# Patient Record
Sex: Male | Born: 1975 | Hispanic: No | Marital: Married | Smoking: Never smoker
Health system: Southern US, Community
[De-identification: ages and names within clinical notes are randomized; demographics above are authoritative.]

## PROBLEM LIST (undated history)

## (undated) DIAGNOSIS — J321 Chronic frontal sinusitis: Secondary | ICD-10-CM

## (undated) DIAGNOSIS — M5090 Cervical disc disorder, unspecified, unspecified cervical region: Secondary | ICD-10-CM

## (undated) DIAGNOSIS — T7840XA Allergy, unspecified, initial encounter: Secondary | ICD-10-CM

## (undated) DIAGNOSIS — R519 Headache, unspecified: Secondary | ICD-10-CM

## (undated) HISTORY — PX: APPENDECTOMY: SHX54

## (undated) HISTORY — DX: Allergy, unspecified, initial encounter: T78.40XA

## (undated) HISTORY — PX: MOUTH SURGERY: SHX715

## (undated) HISTORY — DX: Headache, unspecified: R51.9

## (undated) HISTORY — DX: Cervical disc disorder, unspecified, unspecified cervical region: M50.90

## (undated) HISTORY — DX: Chronic frontal sinusitis: J32.1

---

## 2009-05-27 ENCOUNTER — Ambulatory Visit: Payer: Self-pay | Admitting: Internal Medicine

## 2010-11-03 ENCOUNTER — Emergency Department (HOSPITAL_COMMUNITY)
Admission: EM | Admit: 2010-11-03 | Discharge: 2010-11-03 | Disposition: A | Discharge status: Home / Self Care | Payer: No Typology Code available for payment source | Attending: Emergency Medicine | Admitting: Emergency Medicine

## 2010-11-03 ENCOUNTER — Emergency Department (HOSPITAL_COMMUNITY): Payer: No Typology Code available for payment source

## 2010-11-03 DIAGNOSIS — S41109A Unspecified open wound of unspecified upper arm, initial encounter: Secondary | ICD-10-CM | POA: Insufficient documentation

## 2010-11-03 DIAGNOSIS — S46909A Unspecified injury of unspecified muscle, fascia and tendon at shoulder and upper arm level, unspecified arm, initial encounter: Secondary | ICD-10-CM | POA: Insufficient documentation

## 2010-11-03 DIAGNOSIS — Y93H9 Activity, other involving exterior property and land maintenance, building and construction: Secondary | ICD-10-CM | POA: Insufficient documentation

## 2010-11-03 DIAGNOSIS — W268XXA Contact with other sharp object(s), not elsewhere classified, initial encounter: Secondary | ICD-10-CM | POA: Insufficient documentation

## 2010-11-03 DIAGNOSIS — Y92009 Unspecified place in unspecified non-institutional (private) residence as the place of occurrence of the external cause: Secondary | ICD-10-CM | POA: Insufficient documentation

## 2012-01-02 DIAGNOSIS — Z0000 Encounter for general adult medical examination without abnormal findings: Secondary | ICD-10-CM | POA: Insufficient documentation

## 2015-11-17 ENCOUNTER — Emergency Department (HOSPITAL_COMMUNITY): Payer: PRIVATE HEALTH INSURANCE

## 2015-11-17 ENCOUNTER — Emergency Department (HOSPITAL_COMMUNITY): Payer: PRIVATE HEALTH INSURANCE | Admitting: Anesthesiology

## 2015-11-17 ENCOUNTER — Observation Stay (HOSPITAL_COMMUNITY)
Admission: EM | Admit: 2015-11-17 | Discharge: 2015-11-18 | Disposition: A | Discharge status: Home / Self Care | Payer: PRIVATE HEALTH INSURANCE | Attending: Surgery | Admitting: Surgery

## 2015-11-17 ENCOUNTER — Encounter (HOSPITAL_COMMUNITY)
Admission: EM | Disposition: A | Discharge status: Home / Self Care | Payer: Self-pay | Source: Home / Self Care | Attending: Emergency Medicine

## 2015-11-17 ENCOUNTER — Encounter (HOSPITAL_COMMUNITY): Payer: Self-pay

## 2015-11-17 DIAGNOSIS — Z87891 Personal history of nicotine dependence: Secondary | ICD-10-CM | POA: Insufficient documentation

## 2015-11-17 DIAGNOSIS — K353 Acute appendicitis with localized peritonitis, without perforation or gangrene: Secondary | ICD-10-CM

## 2015-11-17 DIAGNOSIS — Z9049 Acquired absence of other specified parts of digestive tract: Secondary | ICD-10-CM

## 2015-11-17 DIAGNOSIS — K3580 Unspecified acute appendicitis: Secondary | ICD-10-CM | POA: Diagnosis present

## 2015-11-17 DIAGNOSIS — R1033 Periumbilical pain: Secondary | ICD-10-CM | POA: Diagnosis present

## 2015-11-17 HISTORY — PX: LAPAROSCOPIC APPENDECTOMY: SHX408

## 2015-11-17 LAB — CBC WITH DIFFERENTIAL/PLATELET
Basophils Absolute: 0 10*3/uL (ref 0.0–0.1)
Basophils Relative: 0 %
Eosinophils Absolute: 0.1 10*3/uL (ref 0.0–0.7)
Eosinophils Relative: 1 %
HCT: 44.8 % (ref 39.0–52.0)
Hemoglobin: 15.4 g/dL (ref 13.0–17.0)
Lymphocytes Relative: 17 %
Lymphs Abs: 1.5 10*3/uL (ref 0.7–4.0)
MCH: 29.6 pg (ref 26.0–34.0)
MCHC: 34.4 g/dL (ref 30.0–36.0)
MCV: 86.2 fL (ref 78.0–100.0)
Monocytes Absolute: 0.6 10*3/uL (ref 0.1–1.0)
Monocytes Relative: 7 %
Neutro Abs: 6.8 10*3/uL (ref 1.7–7.7)
Neutrophils Relative %: 75 %
Platelets: 199 10*3/uL (ref 150–400)
RBC: 5.2 MIL/uL (ref 4.22–5.81)
RDW: 13 % (ref 11.5–15.5)
WBC: 9 10*3/uL (ref 4.0–10.5)

## 2015-11-17 LAB — URINALYSIS, ROUTINE W REFLEX MICROSCOPIC
Bilirubin Urine: NEGATIVE
Glucose, UA: NEGATIVE mg/dL
Hgb urine dipstick: NEGATIVE
Ketones, ur: NEGATIVE mg/dL
Leukocytes, UA: NEGATIVE
Nitrite: NEGATIVE
Protein, ur: NEGATIVE mg/dL
Specific Gravity, Urine: >1.046 — ABNORMAL HIGH (ref 1.005–1.030)
pH: 5.5 (ref 5.0–8.0)

## 2015-11-17 LAB — COMPREHENSIVE METABOLIC PANEL
ALT: 21 U/L (ref 17–63)
AST: 22 U/L (ref 15–41)
Albumin: 4.3 g/dL (ref 3.5–5.0)
Alkaline Phosphatase: 51 U/L (ref 38–126)
Anion gap: 11 (ref 5–15)
BUN: 20 mg/dL (ref 6–20)
CO2: 22 mmol/L (ref 22–32)
Calcium: 9 mg/dL (ref 8.9–10.3)
Chloride: 105 mmol/L (ref 101–111)
Creatinine, Ser: 1.12 mg/dL (ref 0.61–1.24)
GFR calc Af Amer: >60 mL/min (ref >60)
GFR calc non Af Amer: >60 mL/min (ref >60)
Glucose, Bld: 85 mg/dL (ref 65–99)
Potassium: 3.7 mmol/L (ref 3.5–5.1)
Sodium: 138 mmol/L (ref 135–145)
Total Bilirubin: 0.8 mg/dL (ref 0.3–1.2)
Total Protein: 7.7 g/dL (ref 6.5–8.1)

## 2015-11-17 LAB — I-STAT CG4 LACTIC ACID, ED: Lactic Acid, Venous: 0.95 mmol/L (ref 0.5–2.0)

## 2015-11-17 LAB — LIPASE, BLOOD: Lipase: 29 U/L (ref 11–51)

## 2015-11-17 SURGERY — APPENDECTOMY, LAPAROSCOPIC
Anesthesia: General

## 2015-11-17 MED ORDER — MORPHINE SULFATE (PF) 4 MG/ML IV SOLN
4.0000 mg | Freq: Once | INTRAVENOUS | Status: AC
  Administered 2015-11-17: 4 mg via INTRAVENOUS
  Filled 2015-11-17: qty 1

## 2015-11-17 MED ORDER — METRONIDAZOLE IN NACL 5-0.79 MG/ML-% IV SOLN
500.0000 mg | INTRAVENOUS | Status: AC
  Administered 2015-11-17: 500 mg via INTRAVENOUS
  Filled 2015-11-17: qty 100

## 2015-11-17 MED ORDER — ACETAMINOPHEN 650 MG RE SUPP: 650.0000 mg | Freq: Four times a day (QID) | RECTAL | Status: DC | PRN

## 2015-11-17 MED ORDER — ONDANSETRON 4 MG PO TBDP: 4.0000 mg | ORAL_TABLET | Freq: Four times a day (QID) | ORAL | Status: DC | PRN

## 2015-11-17 MED ORDER — HYDROMORPHONE HCL 1 MG/ML IJ SOLN: 0.2500 mg | INTRAMUSCULAR | Status: DC | PRN

## 2015-11-17 MED ORDER — ONDANSETRON HCL 4 MG/2ML IJ SOLN: 4.0000 mg | Freq: Four times a day (QID) | INTRAMUSCULAR | Status: DC | PRN

## 2015-11-17 MED ORDER — GLYCOPYRROLATE 0.2 MG/ML IJ SOLN
INTRAMUSCULAR | Status: DC | PRN
  Administered 2015-11-17: 0.2 mg via INTRAVENOUS

## 2015-11-17 MED ORDER — LACTATED RINGERS IV SOLN
INTRAVENOUS | Status: DC | PRN
  Administered 2015-11-17 (×2): via INTRAVENOUS

## 2015-11-17 MED ORDER — IOPAMIDOL (ISOVUE-300) INJECTION 61%
100.0000 mL | Freq: Once | INTRAVENOUS | Status: AC | PRN
  Administered 2015-11-17: 100 mL via INTRAVENOUS

## 2015-11-17 MED ORDER — HEPARIN SODIUM (PORCINE) 5000 UNIT/ML IJ SOLN
5000.0000 [IU] | Freq: Three times a day (TID) | INTRAMUSCULAR | Status: DC
  Administered 2015-11-18: 5000 [IU] via SUBCUTANEOUS
  Filled 2015-11-17 (×4): qty 1

## 2015-11-17 MED ORDER — SODIUM CHLORIDE 0.9 % IV BOLUS (SEPSIS)
1000.0000 mL | Freq: Once | INTRAVENOUS | Status: AC
  Administered 2015-11-17: 1000 mL via INTRAVENOUS

## 2015-11-17 MED ORDER — HYDROCODONE-ACETAMINOPHEN 5-325 MG PO TABS
1.0000 | ORAL_TABLET | ORAL | Status: DC | PRN
  Administered 2015-11-18 (×2): 2 via ORAL
  Filled 2015-11-17 (×2): qty 2

## 2015-11-17 MED ORDER — DEXAMETHASONE SODIUM PHOSPHATE 10 MG/ML IJ SOLN
INTRAMUSCULAR | Status: DC | PRN
  Administered 2015-11-17: 10 mg via INTRAVENOUS

## 2015-11-17 MED ORDER — DEXTROSE 5 % IV SOLN: 2.0000 g | INTRAVENOUS | Status: DC

## 2015-11-17 MED ORDER — IOHEXOL 300 MG/ML  SOLN
50.0000 mL | Freq: Once | INTRAMUSCULAR | Status: AC | PRN
  Administered 2015-11-17: 50 mL via ORAL

## 2015-11-17 MED ORDER — SUGAMMADEX SODIUM 200 MG/2ML IV SOLN
INTRAVENOUS | Status: DC | PRN
  Administered 2015-11-17: 200 mg via INTRAVENOUS

## 2015-11-17 MED ORDER — MIDAZOLAM HCL 2 MG/2ML IJ SOLN
INTRAMUSCULAR | Status: AC
  Filled 2015-11-17: qty 2

## 2015-11-17 MED ORDER — MORPHINE SULFATE (PF) 2 MG/ML IV SOLN
1.0000 mg | INTRAVENOUS | Status: DC | PRN
  Administered 2015-11-17: 1 mg via INTRAVENOUS
  Filled 2015-11-17: qty 1

## 2015-11-17 MED ORDER — ONDANSETRON HCL 4 MG/2ML IJ SOLN
INTRAMUSCULAR | Status: AC
  Filled 2015-11-17: qty 2

## 2015-11-17 MED ORDER — LACTATED RINGERS IV SOLN: INTRAVENOUS | Status: DC

## 2015-11-17 MED ORDER — LACTATED RINGERS IV SOLN: INTRAVENOUS | Status: DC | PRN

## 2015-11-17 MED ORDER — ONDANSETRON HCL 4 MG/2ML IJ SOLN
INTRAMUSCULAR | Status: DC | PRN
  Administered 2015-11-17: 4 mg via INTRAVENOUS

## 2015-11-17 MED ORDER — SUCCINYLCHOLINE CHLORIDE 20 MG/ML IJ SOLN
INTRAMUSCULAR | Status: DC | PRN
  Administered 2015-11-17: 140 mg via INTRAVENOUS

## 2015-11-17 MED ORDER — ROCURONIUM BROMIDE 100 MG/10ML IV SOLN
INTRAVENOUS | Status: DC | PRN
  Administered 2015-11-17: 5 mg via INTRAVENOUS
  Administered 2015-11-17: 35 mg via INTRAVENOUS

## 2015-11-17 MED ORDER — ACETAMINOPHEN 325 MG PO TABS: 650.0000 mg | ORAL_TABLET | Freq: Four times a day (QID) | ORAL | Status: DC | PRN

## 2015-11-17 MED ORDER — LIDOCAINE HCL (CARDIAC) 20 MG/ML IV SOLN
INTRAVENOUS | Status: DC | PRN
  Administered 2015-11-17: 100 mg via INTRAVENOUS
  Administered 2015-11-17: 25 mg via INTRATRACHEAL

## 2015-11-17 MED ORDER — DEXTROSE 5 % IV SOLN
2.0000 g | INTRAVENOUS | Status: AC
  Administered 2015-11-17: 2 g via INTRAVENOUS
  Filled 2015-11-17: qty 2

## 2015-11-17 MED ORDER — HYDROMORPHONE HCL 1 MG/ML IJ SOLN
1.0000 mg | Freq: Once | INTRAMUSCULAR | Status: AC
  Administered 2015-11-17: 1 mg via INTRAVENOUS
  Filled 2015-11-17: qty 1

## 2015-11-17 MED ORDER — FENTANYL CITRATE (PF) 100 MCG/2ML IJ SOLN
INTRAMUSCULAR | Status: DC | PRN
  Administered 2015-11-17: 100 ug via INTRAVENOUS
  Administered 2015-11-17: 150 ug via INTRAVENOUS
  Administered 2015-11-17: 50 ug via INTRAVENOUS

## 2015-11-17 MED ORDER — BUPIVACAINE LIPOSOME 1.3 % IJ SUSP
20.0000 mL | Freq: Once | INTRAMUSCULAR | Status: AC
  Administered 2015-11-17: 20 mL
  Filled 2015-11-17: qty 20

## 2015-11-17 MED ORDER — ENOXAPARIN SODIUM 40 MG/0.4ML ~~LOC~~ SOLN
40.0000 mg | SUBCUTANEOUS | Status: DC
  Filled 2015-11-17: qty 0.4

## 2015-11-17 MED ORDER — SODIUM CHLORIDE 0.9 % IV SOLN
INTRAVENOUS | Status: DC
  Administered 2015-11-17: 17:00:00 via INTRAVENOUS

## 2015-11-17 MED ORDER — SUGAMMADEX SODIUM 200 MG/2ML IV SOLN
INTRAVENOUS | Status: AC
  Filled 2015-11-17: qty 2

## 2015-11-17 MED ORDER — KCL IN DEXTROSE-NACL 20-5-0.45 MEQ/L-%-% IV SOLN
INTRAVENOUS | Status: DC
  Administered 2015-11-17: 1000 mL via INTRAVENOUS
  Administered 2015-11-18: 06:00:00 via INTRAVENOUS
  Filled 2015-11-17 (×3): qty 1000

## 2015-11-17 MED ORDER — DIPHENHYDRAMINE HCL 25 MG PO CAPS: 25.0000 mg | ORAL_CAPSULE | Freq: Four times a day (QID) | ORAL | Status: DC | PRN

## 2015-11-17 MED ORDER — PROPOFOL 10 MG/ML IV BOLUS
INTRAVENOUS | Status: AC
  Filled 2015-11-17: qty 40

## 2015-11-17 MED ORDER — PROPOFOL 10 MG/ML IV BOLUS
INTRAVENOUS | Status: DC | PRN
  Administered 2015-11-17: 300 mg via INTRAVENOUS

## 2015-11-17 MED ORDER — PIPERACILLIN-TAZOBACTAM 3.375 G IVPB 30 MIN
3.3750 g | Freq: Three times a day (TID) | INTRAVENOUS | Status: AC
  Administered 2015-11-17: 3.375 g via INTRAVENOUS
  Filled 2015-11-17: qty 50

## 2015-11-17 MED ORDER — IOHEXOL 300 MG/ML  SOLN: 50.0000 mL | Freq: Once | INTRAMUSCULAR | Status: DC | PRN

## 2015-11-17 MED ORDER — ROCURONIUM BROMIDE 100 MG/10ML IV SOLN
INTRAVENOUS | Status: AC
  Filled 2015-11-17: qty 1

## 2015-11-17 MED ORDER — MORPHINE SULFATE (PF) 2 MG/ML IV SOLN: 2.0000 mg | INTRAVENOUS | Status: DC | PRN

## 2015-11-17 MED ORDER — FENTANYL CITRATE (PF) 250 MCG/5ML IJ SOLN
INTRAMUSCULAR | Status: AC
  Filled 2015-11-17: qty 5

## 2015-11-17 MED ORDER — NAPROXEN 500 MG PO TABS
500.0000 mg | ORAL_TABLET | Freq: Two times a day (BID) | ORAL | Status: DC | PRN
  Filled 2015-11-17: qty 1

## 2015-11-17 MED ORDER — DEXAMETHASONE SODIUM PHOSPHATE 10 MG/ML IJ SOLN
INTRAMUSCULAR | Status: AC
  Filled 2015-11-17: qty 1

## 2015-11-17 MED ORDER — OXYCODONE-ACETAMINOPHEN 5-325 MG PO TABS: 1.0000 | ORAL_TABLET | ORAL | Status: DC | PRN

## 2015-11-17 MED ORDER — DIPHENHYDRAMINE HCL 50 MG/ML IJ SOLN: 25.0000 mg | Freq: Four times a day (QID) | INTRAMUSCULAR | Status: DC | PRN

## 2015-11-17 MED ORDER — MIDAZOLAM HCL 5 MG/5ML IJ SOLN
INTRAMUSCULAR | Status: DC | PRN
  Administered 2015-11-17: 2 mg via INTRAVENOUS

## 2015-11-17 MED ORDER — LIDOCAINE HCL (CARDIAC) 20 MG/ML IV SOLN
INTRAVENOUS | Status: AC
  Filled 2015-11-17: qty 5

## 2015-11-17 MED ORDER — DIPHENHYDRAMINE HCL 50 MG/ML IJ SOLN
25.0000 mg | Freq: Once | INTRAMUSCULAR | Status: AC
Start: 2015-11-17 — End: 2015-11-17
  Administered 2015-11-17: 25 mg via INTRAVENOUS
  Filled 2015-11-17: qty 1

## 2015-11-17 SURGICAL SUPPLY — 35 items
APPLIER CLIP ROT 10 11.4 M/L (STAPLE) ×3
CABLE HIGH FREQUENCY MONO STRZ (ELECTRODE) ×3 IMPLANT
CLIP APPLIE ROT 10 11.4 M/L (STAPLE) ×1 IMPLANT
COVER SURGICAL LIGHT HANDLE (MISCELLANEOUS) ×3 IMPLANT
CUTTER FLEX LINEAR 45M (STAPLE) ×3 IMPLANT
DECANTER SPIKE VIAL GLASS SM (MISCELLANEOUS) ×3 IMPLANT
DRAPE LAPAROSCOPIC ABDOMINAL (DRAPES) ×3 IMPLANT
ELECT REM PT RETURN 9FT ADLT (ELECTROSURGICAL) ×3
ELECTRODE REM PT RTRN 9FT ADLT (ELECTROSURGICAL) ×1 IMPLANT
ENDOLOOP SUT PDS II  0 18 (SUTURE)
ENDOLOOP SUT PDS II 0 18 (SUTURE) IMPLANT
GLOVE BIOGEL M 8.0 STRL (GLOVE) ×3 IMPLANT
GOWN STRL REUS W/TWL XL LVL3 (GOWN DISPOSABLE) ×6 IMPLANT
KIT BASIN OR (CUSTOM PROCEDURE TRAY) ×3 IMPLANT
LIQUID BAND (GAUZE/BANDAGES/DRESSINGS) ×3 IMPLANT
NEEDLE INSUFFLATION 14GA 120MM (NEEDLE) ×3 IMPLANT
POUCH RETRIEVAL ECOSAC 10 (ENDOMECHANICALS) IMPLANT
POUCH RETRIEVAL ECOSAC 10MM (ENDOMECHANICALS)
POUCH SPECIMEN RETRIEVAL 10MM (ENDOMECHANICALS) IMPLANT
RELOAD 45 VASCULAR/THIN (ENDOMECHANICALS) IMPLANT
RELOAD STAPLE TA45 3.5 REG BLU (ENDOMECHANICALS) ×3 IMPLANT
SCISSORS LAP 5X45 EPIX DISP (ENDOMECHANICALS) IMPLANT
SCRUB PCMX 4 OZ (MISCELLANEOUS) ×3 IMPLANT
SET IRRIG TUBING LAPAROSCOPIC (IRRIGATION / IRRIGATOR) ×3 IMPLANT
SHEARS HARMONIC ACE PLUS 45CM (MISCELLANEOUS) ×3 IMPLANT
SLEEVE XCEL OPT CAN 5 100 (ENDOMECHANICALS) ×3 IMPLANT
STAPLER VISISTAT 35W (STAPLE) IMPLANT
SUT VIC AB 4-0 SH 18 (SUTURE) ×3 IMPLANT
TOWEL OR 17X26 10 PK STRL BLUE (TOWEL DISPOSABLE) ×3 IMPLANT
TRAY FOLEY W/METER SILVER 14FR (SET/KITS/TRAYS/PACK) ×3 IMPLANT
TRAY LAPAROSCOPIC (CUSTOM PROCEDURE TRAY) ×3 IMPLANT
TROCAR BLADELESS OPT 5 100 (ENDOMECHANICALS) ×3 IMPLANT
TROCAR XCEL 12X100 BLDLESS (ENDOMECHANICALS) ×3 IMPLANT
TROCAR XCEL BLUNT TIP 100MML (ENDOMECHANICALS) ×3 IMPLANT
TROCAR XCEL NON-BLD 11X100MML (ENDOMECHANICALS) IMPLANT

## 2015-11-17 NOTE — ED Notes (Addendum)
Pt with abdominal pain since last night.  ? Fever 102.  No n/v/d.  No change in urination.  RLQ pain. Worse with pressure.  Pt went to urgent care and told to come here.

## 2015-11-17 NOTE — Anesthesia Procedure Notes (Signed)
Procedure Name: Intubation Date/Time: 11/17/2015 5:39 PM Performed by: Illene SilverEVANS, Evah Rashid E Pre-anesthesia Checklist: Patient identified, Emergency Drugs available, Suction available and Patient being monitored Patient Re-evaluated:Patient Re-evaluated prior to inductionOxygen Delivery Method: Circle System Utilized Preoxygenation: Pre-oxygenation with 100% oxygen Intubation Type: IV induction Ventilation: Mask ventilation without difficulty Laryngoscope Size: Mac and 4 Grade View: Grade III Tube type: Oral Tube size: 8.0 mm Number of attempts: 1 Airway Equipment and Method: Stylet and Oral airway Placement Confirmation: ETT inserted through vocal cords under direct vision,  positive ETCO2 and breath sounds checked- equal and bilateral (slipped ETT under epiglottis with CO@ positive ) Secured at: 22 cm Tube secured with: Tape Dental Injury: Teeth and Oropharynx as per pre-operative assessment  Difficulty Due To: Difficult Airway- due to anterior larynx and Difficult Airway- due to large tongue

## 2015-11-17 NOTE — Anesthesia Preprocedure Evaluation (Signed)
Anesthesia Evaluation  Patient identified by MRN, date of birth, ID band Patient awake    Reviewed: Allergy & Precautions, H&P , NPO status , Patient's Chart, lab work & pertinent test results  Airway Mallampati: II  TM Distance: >3 FB Neck ROM: full    Dental no notable dental hx. (+) Teeth Intact, Dental Advisory Given   Pulmonary neg pulmonary ROS, former smoker,    Pulmonary exam normal breath sounds clear to auscultation       Cardiovascular Exercise Tolerance: Good negative cardio ROS Normal cardiovascular exam Rhythm:regular Rate:Normal     Neuro/Psych negative neurological ROS  negative psych ROS   GI/Hepatic negative GI ROS, Neg liver ROS,   Endo/Other  negative endocrine ROS  Renal/GU negative Renal ROS  negative genitourinary   Musculoskeletal   Abdominal   Peds  Hematology negative hematology ROS (+)   Anesthesia Other Findings   Reproductive/Obstetrics negative OB ROS                             Anesthesia Physical Anesthesia Plan  ASA: I and emergent  Anesthesia Plan: General   Post-op Pain Management:    Induction: Intravenous, Rapid sequence and Cricoid pressure planned  Airway Management Planned: Oral ETT  Additional Equipment:   Intra-op Plan:   Post-operative Plan: Extubation in OR  Informed Consent: I have reviewed the patients History and Physical, chart, labs and discussed the procedure including the risks, benefits and alternatives for the proposed anesthesia with the patient or authorized representative who has indicated his/her understanding and acceptance.   Dental Advisory Given  Plan Discussed with: CRNA and Surgeon  Anesthesia Plan Comments:         Anesthesia Quick Evaluation

## 2015-11-17 NOTE — Op Note (Signed)
Surgeon: Wenda LowMatt Skilar Marcou, MD, FACS  Asst:  Madelin RearJosh Rickey, MD  Anes:  general  Procedure: Laparoscopic appendectomy  Diagnosis: Acute appendicitis  Complications: None noted  EBL:   minimal cc  Drains: none  Description of Procedure:  [facility]  After anesthesia was administered and the patient was prepped a timeout was performed.  Access to the abdomen was achieved with Hasson technique above the umbilicus (prior umbilical hernia repair).  A 5 mm was placed in the lower midline and in the LLQ.  The appendix was plump and with slight exudate.  The base was easily visualized and isolated and divided with the blue load 4.5 cm Ethicon stapler.  The mesentery of the appendix was transected with the harmonic scalpel.  Hemostasis was achieved.  The appendix was removed in a bag.  The Hasson port was closed with 0 vicryl and 5-0 monocryl.  The skin of the two other ports were closed with monocryl as well.  Liquiban was applied.    The patient tolerated the procedure well and was taken to the PACU in stable condition.     Frank B. Daphine DeutscherMartin, MD, [facility], [place] [phone]

## 2015-11-17 NOTE — ED Notes (Signed)
Off floor for testing 

## 2015-11-17 NOTE — H&P (Signed)
Chief Complaint: abdominal pain HPI: Frank Kelly is a healthy 40 year old male who presents with sudden onset periumbilical abdominal pain which started at [facility] last night. This then migrated to the RLQ.  Persisted overnight.  Associated with fevers of 102, chills and sweats.  Denies nausea, vomiting or diarrhea.  Denies melena or hematochezia. Denies previous symptoms.  No modifying factors.  No aggravating or alleviating factors.  Last oral intake was 7PM last night. Work up shows a normal WBC, renal function and UA.  CT of a/p consistent with acute appendicitis.  We have therefore been asked to evaluate.    History reviewed. No pertinent past medical history.  Past Surgical History  Procedure Laterality Date  . Mouth surgery      History reviewed. No pertinent family history. Social History:  reports that he has quit smoking. He does not have any smokeless tobacco history on file. He reports that he drinks alcohol. He reports that he does not use illicit drugs.  Allergies: No Known Allergies   (Not in a hospital admission)  Results for orders placed or performed during the hospital encounter of 11/17/15 (from the past 48 hour(s))  CBC with Differential     Status: None   Collection Time: 11/17/15  1:50 PM  Result Value Ref Range   WBC 9.0 4.0 - 10.5 K/uL   RBC 5.20 4.22 - 5.81 MIL/uL   Hemoglobin 15.4 13.0 - 17.0 g/dL   HCT 44.8 39.0 - 52.0 %   MCV 86.2 78.0 - 100.0 fL   MCH 29.6 26.0 - 34.0 pg   MCHC 34.4 30.0 - 36.0 g/dL   RDW 13.0 11.5 - 15.5 %   Platelets 199 150 - 400 K/uL   Neutrophils Relative % 75 %   Neutro Abs 6.8 1.7 - 7.7 K/uL   Lymphocytes Relative 17 %   Lymphs Abs 1.5 0.7 - 4.0 K/uL   Monocytes Relative 7 %   Monocytes Absolute 0.6 0.1 - 1.0 K/uL   Eosinophils Relative 1 %   Eosinophils Absolute 0.1 0.0 - 0.7 K/uL   Basophils Relative 0 %   Basophils Absolute 0.0 0.0 - 0.1 K/uL  Comprehensive metabolic panel     Status: None   Collection Time: 11/17/15   1:50 PM  Result Value Ref Range   Sodium 138 135 - 145 mmol/L   Potassium 3.7 3.5 - 5.1 mmol/L   Chloride 105 101 - 111 mmol/L   CO2 22 22 - 32 mmol/L   Glucose, Bld 85 65 - 99 mg/dL   BUN 20 6 - 20 mg/dL   Creatinine, Ser 1.12 0.61 - 1.24 mg/dL   Calcium 9.0 8.9 - 10.3 mg/dL   Total Protein 7.7 6.5 - 8.1 g/dL   Albumin 4.3 3.5 - 5.0 g/dL   AST 22 15 - 41 U/L   ALT 21 17 - 63 U/L   Alkaline Phosphatase 51 38 - 126 U/L   Total Bilirubin 0.8 0.3 - 1.2 mg/dL   GFR calc non Af Amer >60 >60 mL/min   GFR calc Af Amer >60 >60 mL/min    Comment: (NOTE) The eGFR has been calculated using the CKD EPI equation. This calculation has not been validated in all clinical situations. eGFR's persistently <60 mL/min signify possible Chronic Kidney Disease.    Anion gap 11 5 - 15  Lipase, blood     Status: None   Collection Time: 11/17/15  1:50 PM  Result Value Ref Range   Lipase  29 11 - 51 U/L  I-Stat CG4 Lactic Acid, ED     Status: None   Collection Time: 11/17/15  1:57 PM  Result Value Ref Range   Lactic Acid, Venous 0.95 0.5 - 2.0 mmol/L  Urinalysis, Routine w reflex microscopic     Status: Abnormal   Collection Time: 11/17/15  3:42 PM  Result Value Ref Range   Color, Urine YELLOW YELLOW   APPearance CLEAR CLEAR   Specific Gravity, Urine >1.046 (H) 1.005 - 1.030   pH 5.5 5.0 - 8.0   Glucose, UA NEGATIVE NEGATIVE mg/dL   Hgb urine dipstick NEGATIVE NEGATIVE   Bilirubin Urine NEGATIVE NEGATIVE   Ketones, ur NEGATIVE NEGATIVE mg/dL   Protein, ur NEGATIVE NEGATIVE mg/dL   Nitrite NEGATIVE NEGATIVE   Leukocytes, UA NEGATIVE NEGATIVE    Comment: MICROSCOPIC NOT DONE ON URINES WITH NEGATIVE PROTEIN, BLOOD, LEUKOCYTES, NITRITE, OR GLUCOSE <1000 mg/dL.   Ct Abdomen Pelvis W Contrast  11/17/2015  CLINICAL DATA:  Right lower quadrant pain for 1 day EXAM: CT ABDOMEN AND PELVIS WITH CONTRAST TECHNIQUE: Multidetector CT imaging of the abdomen and pelvis was performed using the standard protocol  following bolus administration of intravenous contrast. CONTRAST:  154m ISOVUE-300 IOPAMIDOL (ISOVUE-300) INJECTION 61% COMPARISON:  None. FINDINGS: Lung bases are free of acute infiltrate or sizable effusion. The liver is diffusely fatty infiltrated. The gallbladder, spleen, adrenal glands and pancreas are within normal limits. The kidneys well visualized bilaterally within normal enhancement pattern. No renal calculi or obstructive changes are noted. Bladder is well distended. The appendix is prominent measuring 12 mm in diameter with some periappendiceal inflammatory changes. No appendicolith is seen. These changes are consistent with very early appendicitis. No significant adenopathy is noted. The bony structures show a scoliosis of the lumbar spine concave to the left. Laxity of the abdominal wall is noted at the level of the umbilicus containing fat. No incarceration is noted. IMPRESSION: Findings consistent with early acute appendicitis as described. These results were called by telephone at the time of interpretation on 11/17/2015 at 3:17 pm to RLorre Munroe who verbally acknowledged these results. Electronically Signed   By: MInez CatalinaM.D.   On: 11/17/2015 15:18    Review of Systems  Constitutional: Positive for fever, chills and diaphoresis. Negative for weight loss and malaise/fatigue.  Eyes: Negative for blurred vision, double vision, photophobia, pain, discharge and redness.  Respiratory: Negative for cough, hemoptysis, sputum production, shortness of breath and wheezing.   Cardiovascular: Negative for chest pain, palpitations, orthopnea, claudication, leg swelling and PND.  Gastrointestinal: Positive for abdominal pain. Negative for heartburn, nausea, vomiting, diarrhea, constipation, blood in stool and melena.  Genitourinary: Negative for dysuria, urgency, frequency, hematuria and flank pain.  Neurological: Negative for dizziness, tingling, tremors, sensory change, speech change, focal  weakness, seizures, loss of consciousness, weakness and headaches.  Psychiatric/Behavioral: Negative for depression, suicidal ideas and substance abuse.    Blood pressure 130/92, pulse 61, temperature 98.2 F (36.8 C), temperature source Oral, resp. rate 18, height 5' 10"  (1.778 m), weight 92.534 kg (204 lb), SpO2 96 %. Physical Exam  Constitutional: He is oriented to person, place, and time. He appears well-developed and well-nourished. No distress.  Cardiovascular: Normal rate, regular rhythm, normal heart sounds and intact distal pulses.  Exam reveals no gallop and no friction rub.   No murmur heard. Respiratory: Effort normal and breath sounds normal. No respiratory distress. He has no wheezes. He has no rales. He exhibits no tenderness.  GI: Soft.  Bowel sounds are normal. He exhibits no distension and no mass. There is no guarding.  RLQ tenderness.   Musculoskeletal: Normal range of motion. He exhibits no edema or tenderness.  Neurological: He is alert and oriented to person, place, and time.  Skin: Skin is warm and dry. No rash noted. He is not diaphoretic. No erythema. No pallor.  Psychiatric: He has a normal mood and affect. His behavior is normal. Judgment and thought content normal.     Assessment/Plan Acute appendicitis-to OR for appendectomy.  Surgical risks discussed including infection, bleeding, injury to surrounding structures, open surgery, anesthesia risks.  The patient verbalizes understanding and wishes to proceed.   Erby Pian, NP   11/17/2015, 4:09 PM

## 2015-11-17 NOTE — ED Provider Notes (Signed)
CSN: [id number]     Arrival date & time 11/17/15  1156 History   First MD Initiated Contact with Patient 11/17/15 1250     Chief Complaint  Patient presents with  . Abdominal Pain     (Consider location/radiation/quality/duration/timing/severity/associated sxs/prior Treatment) HPI Comments: Patient is a healthy 40yo who presents with abdominal pain. The pain began yesterday evening around 5 or 6 in the periumbilical region. The pain has been constant. However, overnight and today, the pain has localized to the RLQ. He describes the pain as sharp and rates it as a 5/10. It does not radiate. He had a fever of 102 last night with associated chills. The pain is worse after eating, walking, or taking a deep breath. It is improved by lying still. He denies any associated nausea, vomiting, or diarrhea. He does endorse a couple more bowel movements than normal yesterday, but they were normal. Patient reports his abdomen felt "tighter" last night. Pt reported some hives on his arms and neck this morning, but they have since resolved. Last PO 8pm last night. Patient had a headache last night, but not now.  Patient is a 40 y.o. male presenting with abdominal pain. The history is provided by the patient.  Abdominal Pain Associated symptoms: chills and fever   Associated symptoms: no chest pain, no dysuria, no nausea, no shortness of breath and no vomiting     History reviewed. No pertinent past medical history. Past Surgical History  Procedure Laterality Date  . Mouth surgery     History reviewed. No pertinent family history. Social History  Substance Use Topics  . Smoking status: Former Games developer  . Smokeless tobacco: None  . Alcohol Use: Yes     Comment: social    Review of Systems  Constitutional: Positive for fever and chills.  HENT: Negative for facial swelling.   Respiratory: Negative for shortness of breath.   Cardiovascular: Negative for chest pain.  Gastrointestinal: Positive for  abdominal pain. Negative for nausea and vomiting.  Genitourinary: Negative for dysuria.  Musculoskeletal: Negative for back pain.  Skin: Negative for rash and wound.  Neurological: Negative for headaches.  Psychiatric/Behavioral: The patient is not nervous/anxious.       Allergies  Review of patient's allergies indicates no known allergies.  Home Medications   Prior to Admission medications   Medication Sig Start Date End Date Taking? Authorizing Provider  cetirizine-pseudoephedrine (ZYRTEC-D) 5-120 MG tablet Take 1 tablet by mouth every 12 (twelve) hours as needed for allergies.   Yes Historical Provider, MD   BP 130/92 mmHg  Pulse 61  Temp(Src) 98.2 F (36.8 C) (Oral)  Resp 18  Ht  (1.778 m)  Wt 92.534 kg  BMI 29.27 kg/m2  SpO2 96% Physical Exam  Constitutional: He appears well-developed and well-nourished. No distress.  HENT:  Head: Normocephalic and atraumatic.  Mouth/Throat: Oropharynx is clear and moist. No oropharyngeal exudate.  Eyes: Conjunctivae are normal. Pupils are equal, round, and reactive to light. Right eye exhibits no discharge. Left eye exhibits no discharge. No scleral icterus.  Neck: Normal range of motion. Neck supple. No thyromegaly present.  Cardiovascular: Normal rate, regular rhythm and normal heart sounds.  Exam reveals no gallop and no friction rub.   No murmur heard. Pulmonary/Chest: Effort normal and breath sounds normal. No stridor. No respiratory distress. He has no wheezes. He has no rales.  Abdominal: Soft. Bowel sounds are normal. He exhibits no distension, no abdominal bruit, no pulsatile midline mass and no mass. There  is tenderness in the right lower quadrant. There is tenderness at McBurney's point. There is no rigidity, no rebound, no guarding and negative Murphy's sign.  +Rovsing's sign  Musculoskeletal: He exhibits no edema.  Lymphadenopathy:    He has no cervical adenopathy.  Neurological: He is alert. Coordination normal.   Skin: Skin is warm and dry. No rash noted. He is not diaphoretic. No pallor.  Psychiatric: He has a normal mood and affect.  Nursing note and vitals reviewed.   ED Course  Procedures (including critical care time) Labs Review Labs Reviewed  URINALYSIS, ROUTINE W REFLEX MICROSCOPIC ([facility]) - Abnormal; Notable for the following:    Specific Gravity, Urine >1.046 (*)    All other components within normal limits  CBC WITH DIFFERENTIAL/PLATELET  COMPREHENSIVE METABOLIC PANEL  LIPASE, BLOOD  I-STAT CG4 LACTIC ACID, ED    Imaging Review Ct Abdomen Pelvis W Contrast  11/17/2015  CLINICAL DATA:  Right lower quadrant pain for 1 day EXAM: CT ABDOMEN AND PELVIS WITH CONTRAST TECHNIQUE: Multidetector CT imaging of the abdomen and pelvis was performed using the standard protocol following bolus administration of intravenous contrast. CONTRAST:  100mL ISOVUE-300 IOPAMIDOL (ISOVUE-300) INJECTION 61% COMPARISON:  None. FINDINGS: Lung bases are free of acute infiltrate or sizable effusion. The liver is diffusely fatty infiltrated. The gallbladder, spleen, adrenal glands and pancreas are within normal limits. The kidneys well visualized bilaterally within normal enhancement pattern. No renal calculi or obstructive changes are noted. Bladder is well distended. The appendix is prominent measuring 12 mm in diameter with some periappendiceal inflammatory changes. No appendicolith is seen. These changes are consistent with very early appendicitis. No significant adenopathy is noted. The bony structures show a scoliosis of the lumbar spine concave to the left. Laxity of the abdominal wall is noted at the level of the umbilicus containing fat. No incarceration is noted. IMPRESSION: Findings consistent with early acute appendicitis as described. These results were called by telephone at the time of interpretation on 11/17/2015 at 3:17 pm to Ivar Drapeob Browning, who verbally acknowledged these results. Electronically Signed    By: Alcide CleverMark  Lukens M.D.   On: 11/17/2015 15:18   I have personally reviewed and evaluated these images and lab results as part of my medical decision-making.   EKG Interpretation None      3:00pm Rad Tech informed me that patient started breaking out in hives and itching during CT scan in the same places the patient reported having hives earlier this morning. After talking with the patient, he reported his pain is worse since being back from CT. I will give Dilaudid 1mg  and Benadryl.  MDM   Patient is nontoxic, nonseptic appearing, in no apparent distress.  Patient's pain and other symptoms adequately managed in emergency department.  Fluid bolus given. Pain controlled with Dilaudid 1mg .  Labs and vitals reviewed; unremarkable. CT Abdomen Pelvis shows early acute appendicitis. Ivar Drapeob Browning PA-C consulted general surgery who would like to admit the patient for further treatment and care.   Final diagnoses:  Acute appendicitis with localized peritonitis       Emi Holeslexandra M Monalisa Bayless, PA-C 11/17/15 1658  Linwood DibblesJon Knapp, MD 11/18/15 [phone]

## 2015-11-17 NOTE — ED Notes (Signed)
Bed: WA08 Expected date:  Expected time:  Means of arrival:  Comments: Hold triage  

## 2015-11-17 NOTE — Transfer of Care (Signed)
Immediate Anesthesia Transfer of Care Note  Patient: Frank Kelly  Procedure(s) Performed: Procedure(s): APPENDECTOMY LAPAROSCOPIC (N/A)  Patient Location: PACU  Anesthesia Type:General  Level of Consciousness: awake, alert , oriented and patient cooperative  Airway & Oxygen Therapy: Patient Spontanous Breathing and Patient connected to face mask oxygen  Post-op Assessment: Report given to RN, Post -op Vital signs reviewed and stable and Patient moving all extremities X 4  Post vital signs: stable  Last Vitals:  Filed Vitals:   11/17/15 1228 11/17/15 1635  BP: 130/92 121/87  Pulse: 61 65  Temp: 36.8 C   Resp: 18 18    Complications: No apparent anesthesia complications

## 2015-11-18 ENCOUNTER — Encounter (HOSPITAL_COMMUNITY): Payer: Self-pay | Admitting: Surgery

## 2015-11-18 LAB — CBC
HEMATOCRIT: 41 % (ref 39.0–52.0)
HEMOGLOBIN: 14.1 g/dL (ref 13.0–17.0)
MCH: 29.2 pg (ref 26.0–34.0)
MCHC: 34.4 g/dL (ref 30.0–36.0)
MCV: 84.9 fL (ref 78.0–100.0)
Platelets: 248 10*3/uL (ref 150–400)
RBC: 4.83 MIL/uL (ref 4.22–5.81)
RDW: 12.9 % (ref 11.5–15.5)
WBC: 6.9 10*3/uL (ref 4.0–10.5)

## 2015-11-18 MED ORDER — HYDROCODONE-ACETAMINOPHEN 5-325 MG PO TABS: 1.0000 | ORAL_TABLET | ORAL | Status: DC | PRN

## 2015-11-18 NOTE — Discharge Instructions (Signed)

## 2015-11-18 NOTE — Anesthesia Postprocedure Evaluation (Signed)
Anesthesia Post Note  Patient: Sherian Reinerez Goehring  Procedure(s) Performed: Procedure(s) (LRB): APPENDECTOMY LAPAROSCOPIC (N/A)  Patient location during evaluation: PACU Anesthesia Type: General Level of consciousness: awake and alert Pain management: pain level controlled Vital Signs Assessment: post-procedure vital signs reviewed and stable Respiratory status: spontaneous breathing, nonlabored ventilation, respiratory function stable and patient connected to nasal cannula oxygen Cardiovascular status: blood pressure returned to baseline and stable Postop Assessment: no signs of nausea or vomiting Anesthetic complications: no    Last Vitals:  Filed Vitals:   11/18/15 0535 11/18/15 0700  BP: 117/69 124/70  Pulse: 62 69  Temp: 36.7 C 36.7 C  Resp: 18 18    Last Pain:  Filed Vitals:   11/18/15 0850  PainSc: 0-No pain                 Izayiah Tibbitts L

## 2015-11-18 NOTE — Discharge Summary (Signed)
Physician Discharge Summary  Patient ID: Jonuel Butterfield MRN: [id number] DOB/AGE: 1976-01-26 40 y.o.  Admit date: 11/17/2015 Discharge date: 11/18/2015  Admitting Diagnosis: Acute appendicitis  Discharge Diagnosis Patient Active Problem List   Diagnosis Date Noted  . S/P lap appendectomy March 2017 11/17/2015  . S/P laparoscopic appendectomy 11/17/2015    Consultants none  Imaging: Ct Abdomen Pelvis W Contrast  11/17/2015  CLINICAL DATA:  Right lower quadrant pain for 1 day EXAM: CT ABDOMEN AND PELVIS WITH CONTRAST TECHNIQUE: Multidetector CT imaging of the abdomen and pelvis was performed using the standard protocol following bolus administration of intravenous contrast. CONTRAST:  ISOVUE-300 IOPAMIDOL (ISOVUE-300) INJECTION 61% COMPARISON:  None. FINDINGS: Lung bases are free of acute infiltrate or sizable effusion. The liver is diffusely fatty infiltrated. The gallbladder, spleen, adrenal glands and pancreas are within normal limits. The kidneys well visualized bilaterally within normal enhancement pattern. No renal calculi or obstructive changes are noted. Bladder is well distended. The appendix is prominent measuring 12 mm in diameter with some periappendiceal inflammatory changes. No appendicolith is seen. These changes are consistent with very early appendicitis. No significant adenopathy is noted. The bony structures show a scoliosis of the lumbar spine concave to the left. Laxity of the abdominal wall is noted at the level of the umbilicus containing fat. No incarceration is noted. IMPRESSION: Findings consistent with early acute appendicitis as described. These results were called by telephone at the time of interpretation on 11/17/2015 at 3:17 pm to Ivar Drape, who verbally acknowledged these results. Electronically Signed   By: Alcide Clever M.D.   On: 11/17/2015 15:18    Procedures Laparoscopic appendectomy---Dr. Daphine Deutscher  HPI: Shinichi Anguiano is a healthy 40 year old male  who presents with sudden onset periumbilical abdominal pain which started at [facility] last night. This then migrated to the RLQ. Persisted overnight. Associated with fevers of 102, chills and sweats. Denies nausea, vomiting or diarrhea. Denies melena or hematochezia. Denies previous symptoms. No modifying factors. No aggravating or alleviating factors. Last oral intake was 7PM last night. Work up shows a normal WBC, renal function and UA. CT of a/p consistent with acute appendicitis. We have therefore been asked to evaluate.   Hospital Course:  Patient was admitted and underwent procedure listed above.  Tolerated procedure well and was transferred to the floor.  Diet was advanced as tolerated.  On POD#1, the patient was voiding well, tolerating diet, ambulating well, pain well controlled, vital signs stable, incisions c/d/i and felt stable for discharge home.  Medication risks, benefits and therapeutic alternatives were reviewed with the patient.  He verbalizes understanding.  Patient will follow up in our office in 2 weeks and knows to call with questions or concerns.  Physical Exam: General:  Alert, NAD, pleasant, comfortable Abd:  Soft, ND, mild tenderness, incisions C/D/I    Medication List    TAKE these medications        cetirizine-pseudoephedrine 5-120 MG tablet  Commonly known as:  ZYRTEC-D  Take 1 tablet by mouth every 12 (twelve) hours as needed for allergies.     HYDROcodone-acetaminophen 5-325 MG tablet  Commonly known as:  NORCO/VICODIN  Take 1-2 tablets by mouth every 4 (four) hours as needed for moderate pain.             Follow-up Information    Follow up with [facility] On 12/02/2015.   Specialty:  General Surgery   Why:  arrive by 11:15AM for a 11:45AM post op check  Contact information:   [address] [place] [phone]       Signed: Ashok Norrismina Erianna Jolly, [facility]BC [facility] [phone]  11/18/2015,  9:19 AM

## 2017-02-03 ENCOUNTER — Encounter (HOSPITAL_COMMUNITY): Payer: Self-pay | Admitting: *Deleted

## 2017-02-03 ENCOUNTER — Emergency Department (HOSPITAL_COMMUNITY)
Admission: EM | Admit: 2017-02-03 | Discharge: 2017-02-03 | Disposition: A | Discharge status: Home / Self Care | Payer: PRIVATE HEALTH INSURANCE | Attending: Emergency Medicine | Admitting: Emergency Medicine

## 2017-02-03 DIAGNOSIS — Y999 Unspecified external cause status: Secondary | ICD-10-CM | POA: Insufficient documentation

## 2017-02-03 DIAGNOSIS — M546 Pain in thoracic spine: Secondary | ICD-10-CM | POA: Diagnosis not present

## 2017-02-03 DIAGNOSIS — Y929 Unspecified place or not applicable: Secondary | ICD-10-CM | POA: Insufficient documentation

## 2017-02-03 DIAGNOSIS — Z87891 Personal history of nicotine dependence: Secondary | ICD-10-CM | POA: Diagnosis not present

## 2017-02-03 DIAGNOSIS — Y93I9 Activity, other involving external motion: Secondary | ICD-10-CM | POA: Diagnosis not present

## 2017-02-03 MED ORDER — NAPROXEN 500 MG PO TABS: 500.0000 mg | ORAL_TABLET | Freq: Two times a day (BID) | ORAL | 0 refills | Status: AC

## 2017-02-03 MED ORDER — METHOCARBAMOL 500 MG PO TABS: 500.0000 mg | ORAL_TABLET | Freq: Two times a day (BID) | ORAL | 0 refills | Status: AC
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
## 2017-02-03 NOTE — Discharge Instructions (Signed)
Use naproxen and Robaxin as needed for back pain. Make sure to take the naproxen with food. You will most likely have pain and stiffness for the next 3 or 4 days before symptoms improve. Return to the ED if you develop significant worsening of pain, numbness or tingling, confusion, speech difficulty, or any other neurologic symptoms.
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
## 2017-02-04 ENCOUNTER — Encounter (HOSPITAL_COMMUNITY): Payer: Self-pay | Admitting: Emergency Medicine

## 2017-02-04 ENCOUNTER — Emergency Department (HOSPITAL_COMMUNITY)
Admission: EM | Admit: 2017-02-04 | Discharge: 2017-02-04 | Discharge status: Against Medical Advice | Payer: No Typology Code available for payment source

## 2017-02-04 NOTE — ED Notes (Signed)
No response when called from lobby. 

## 2017-02-04 NOTE — ED Notes (Addendum)
No response when called from lobby. 

## 2017-02-04 NOTE — ED Triage Notes (Addendum)
Error in documentation

## 2017-02-05 ENCOUNTER — Ambulatory Visit (HOSPITAL_COMMUNITY)
Admission: EM | Admit: 2017-02-05 | Discharge: 2017-02-05 | Disposition: A | Discharge status: Home / Self Care | Payer: Self-pay | Attending: Internal Medicine | Admitting: Internal Medicine

## 2017-02-05 ENCOUNTER — Encounter (HOSPITAL_COMMUNITY): Payer: Self-pay | Admitting: Emergency Medicine

## 2017-02-05 DIAGNOSIS — M791 Myalgia: Secondary | ICD-10-CM

## 2017-02-05 DIAGNOSIS — M7918 Myalgia, other site: Secondary | ICD-10-CM

## 2017-02-05 DIAGNOSIS — S39012A Strain of muscle, fascia and tendon of lower back, initial encounter: Secondary | ICD-10-CM

## 2017-02-05 MED ORDER — KETOROLAC TROMETHAMINE 30 MG/ML IJ SOLN
INTRAMUSCULAR | Status: AC
  Filled 2017-02-05: qty 1

## 2017-02-05 MED ORDER — KETOROLAC TROMETHAMINE 30 MG/ML IJ SOLN
30.0000 mg | Freq: Once | INTRAMUSCULAR | Status: AC
  Administered 2017-02-05: 30 mg via INTRAMUSCULAR

## 2017-02-05 MED ORDER — HYDROCODONE-ACETAMINOPHEN 5-325 MG PO TABS: 1.0000 | ORAL_TABLET | ORAL | 0 refills | Status: DC | PRN
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
## 2017-02-07 ENCOUNTER — Ambulatory Visit (HOSPITAL_COMMUNITY)
Admission: EM | Admit: 2017-02-07 | Discharge: 2017-02-07 | Disposition: A | Discharge status: Home / Self Care | Payer: PRIVATE HEALTH INSURANCE

## 2017-02-08 ENCOUNTER — Ambulatory Visit (INDEPENDENT_AMBULATORY_CARE_PROVIDER_SITE_OTHER): Payer: PRIVATE HEALTH INSURANCE

## 2017-02-08 ENCOUNTER — Ambulatory Visit (HOSPITAL_COMMUNITY)
Admission: EM | Admit: 2017-02-08 | Discharge: 2017-02-08 | Disposition: A | Discharge status: Home / Self Care | Payer: No Typology Code available for payment source | Attending: Family Medicine | Admitting: Family Medicine

## 2017-02-08 ENCOUNTER — Encounter (HOSPITAL_COMMUNITY): Payer: Self-pay | Admitting: Emergency Medicine

## 2017-02-08 ENCOUNTER — Ambulatory Visit (INDEPENDENT_AMBULATORY_CARE_PROVIDER_SITE_OTHER): Payer: Self-pay

## 2017-02-08 DIAGNOSIS — M549 Dorsalgia, unspecified: Secondary | ICD-10-CM

## 2017-02-08 DIAGNOSIS — M542 Cervicalgia: Secondary | ICD-10-CM

## 2017-02-08 MED ORDER — OXYCODONE-ACETAMINOPHEN 5-325 MG PO TABS: 1.0000 | ORAL_TABLET | Freq: Four times a day (QID) | ORAL | 0 refills | Status: AC | PRN
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
## 2017-02-08 NOTE — Discharge Instructions (Signed)
Follow up if not improving within the next 3-5 days.
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
## 2017-12-01 ENCOUNTER — Encounter: Payer: Self-pay | Admitting: Urgent Care

## 2017-12-01 ENCOUNTER — Other Ambulatory Visit: Payer: Self-pay

## 2017-12-01 ENCOUNTER — Ambulatory Visit (INDEPENDENT_AMBULATORY_CARE_PROVIDER_SITE_OTHER): Payer: BLUE CROSS/BLUE SHIELD | Admitting: Urgent Care

## 2017-12-01 VITALS — BP 118/64 | HR 78 | Temp 98.0°F | Resp 16 | Ht 70.0 in | Wt 202.0 lb

## 2017-12-01 DIAGNOSIS — Z1329 Encounter for screening for other suspected endocrine disorder: Secondary | ICD-10-CM

## 2017-12-01 DIAGNOSIS — Z114 Encounter for screening for human immunodeficiency virus [HIV]: Secondary | ICD-10-CM | POA: Diagnosis not present

## 2017-12-01 DIAGNOSIS — Z13220 Encounter for screening for lipoid disorders: Secondary | ICD-10-CM | POA: Diagnosis not present

## 2017-12-01 DIAGNOSIS — Z130 Encounter for screening for diseases of the blood and blood-forming organs and certain disorders involving the immune mechanism: Secondary | ICD-10-CM | POA: Diagnosis not present

## 2017-12-01 DIAGNOSIS — K08409 Partial loss of teeth, unspecified cause, unspecified class: Secondary | ICD-10-CM | POA: Diagnosis not present

## 2017-12-01 DIAGNOSIS — Z9049 Acquired absence of other specified parts of digestive tract: Secondary | ICD-10-CM

## 2017-12-01 DIAGNOSIS — Z13228 Encounter for screening for other metabolic disorders: Secondary | ICD-10-CM | POA: Diagnosis not present

## 2017-12-01 DIAGNOSIS — Z0000 Encounter for general adult medical examination without abnormal findings: Secondary | ICD-10-CM

## 2017-12-01 DIAGNOSIS — Z9109 Other allergy status, other than to drugs and biological substances: Secondary | ICD-10-CM | POA: Diagnosis not present

## 2017-12-01 NOTE — Progress Notes (Signed)
MRN: [id number]  Subjective:   [name] is a 42 y.o. male presenting for annual physical exam.  He is doing this exam is requested by his employer.  Patient works as a Psychologist, occupational.  Has good relationships at home, has a good support network.  Denies smoking cigarettes, has 4-5 drinks of alcohol per week.  Medical care team includes: PCP: Patient, No Pcp Per Vision: No visual deficits. Dental: Cleanings every 6 months. Specialists: None.   Frank Kelly has a current medication list which includes the following prescription(s): methocarbamol, naproxen, and oxycodone-acetaminophen. He has No Known Allergies.  Frank Kelly  has a past medical history of Allergy. Also  has a past surgical history that includes Mouth surgery; laparoscopic appendectomy (N/A, 11/17/2015); and Appendectomy. His family history includes Hypertension in his mother.  Review of Systems  Constitutional: Negative for chills, diaphoresis, fever, malaise/fatigue and weight loss.  HENT: Negative for congestion, ear discharge, ear pain, hearing loss, nosebleeds, sore throat and tinnitus.   Eyes: Negative for blurred vision, double vision, photophobia, pain, discharge and redness.  Respiratory: Negative for cough, shortness of breath and wheezing.   Cardiovascular: Negative for chest pain, palpitations and leg swelling.  Gastrointestinal: Negative for abdominal pain, blood in stool, constipation, diarrhea, nausea and vomiting.  Genitourinary: Negative for dysuria, flank pain, frequency, hematuria and urgency.  Musculoskeletal: Negative for back pain, joint pain and myalgias.  Skin: Negative for itching and rash.  Neurological: Negative for dizziness, tingling, seizures, loss of consciousness, weakness and headaches.  Endo/Heme/Allergies: Negative for polydipsia.  Psychiatric/Behavioral: Negative for depression, hallucinations, memory loss, substance abuse and suicidal ideas. The patient is not nervous/anxious and does not have  insomnia.    Objective:   Vitals: BP 118/64   Pulse 78   Temp 98 F (36.7 C) (Oral)   Resp 16   Ht 5\' 10"  (1.778 m)   Wt 202 lb (91.6 kg)   SpO2 98%   BMI 28.98 kg/m   Physical Exam  Constitutional: He is oriented to person, place, and time. He appears well-developed and well-nourished.  HENT:  TM's intact bilaterally, no effusions or erythema. Nasal turbinates pink and moist, nasal passages patent. No sinus tenderness. Oropharynx clear, mucous membranes moist, dentition in good repair.  Eyes: Pupils are equal, round, and reactive to light. Conjunctivae and EOM are normal. Right eye exhibits no discharge. Left eye exhibits no discharge. No scleral icterus.  Neck: Normal range of motion. Neck supple. No thyromegaly present.  Cardiovascular: Normal rate, regular rhythm and intact distal pulses. Exam reveals no gallop and no friction rub.  No murmur heard. Pulmonary/Chest: No stridor. No respiratory distress. He has no wheezes. He has no rales.  Abdominal: Soft. Bowel sounds are normal. He exhibits no distension and no mass. There is no tenderness.  Musculoskeletal: Normal range of motion. He exhibits no edema or tenderness.  Lymphadenopathy:    He has no cervical adenopathy.  Neurological: He is alert and oriented to person, place, and time. He has normal reflexes. He displays normal reflexes. Coordination normal.  Skin: Skin is warm and dry. No rash noted. No erythema. No pallor.  Psychiatric: He has a normal mood and affect.   Assessment and Plan :   Annual physical exam  Screening for metabolic disorder - Plan: Comprehensive metabolic panel  Screening for cholesterol level - Plan: Lipid panel  Screening for deficiency anemia - Plan: CBC  Screening for thyroid disorder - Plan: TSH  Environmental allergies  History of appendectomy  History of tooth  extraction, unspecified edentulism class  Screening for HIV without presence of risk factors - Plan: HIV  antibody  Labs pending, patient is medically stable. Discussed healthy lifestyle, diet, exercise, preventative care, vaccinations, and addressed patient's concerns.  For to complete any biometrics paperwork from his employer as he needs it.  Frank BambergMario Nanea Jared, PA-C [facility] [phone] 12/01/2017  2:44 PM

## 2017-12-01 NOTE — Patient Instructions (Addendum)
Health Maintenance, Male A healthy lifestyle and preventive care is important for your health and wellness. Ask your health care provider about what schedule of regular examinations is right for you. What should I know about weight and diet? Eat a Healthy Diet  Eat plenty of vegetables, fruits, whole grains, low-fat dairy products, and lean protein.  Do not eat a lot of foods high in solid fats, added sugars, or salt.  Maintain a Healthy Weight Regular exercise can help you achieve or maintain a healthy weight. You should:  Do at least 150 minutes of exercise each week. The exercise should increase your heart rate and make you sweat (moderate-intensity exercise).  Do strength-training exercises at least twice a week.  Watch Your Levels of Cholesterol and Blood Lipids  Have your blood tested for lipids and cholesterol every 5 years starting at 44 years of age. If you are at high risk for heart disease, you should start having your blood tested when you are 44 years old. You may need to have your cholesterol levels checked more often if: ? Your lipid or cholesterol levels are high. ? You are older than 44 years of age. ? You are at high risk for heart disease.  What should I know about cancer screening? Many types of cancers can be detected early and may often be prevented. Lung Cancer  You should be screened every year for lung cancer if: ? You are a current smoker who has smoked for at least 30 years. ? You are a former smoker who has quit within the past 15 years.  Talk to your health care provider about your screening options, when you should start screening, and how often you should be screened.  Colorectal Cancer  Routine colorectal cancer screening usually begins at 44 years of age and should be repeated every 5-10 years until you are 44 years old. You may need to be screened more often if early forms of precancerous polyps or small growths are found. Your health care provider  may recommend screening at an earlier age if you have risk factors for colon cancer.  Your health care provider may recommend using home test kits to check for hidden blood in the stool.  A small camera at the end of a tube can be used to examine your colon (sigmoidoscopy or colonoscopy). This checks for the earliest forms of colorectal cancer.  Prostate and Testicular Cancer  Depending on your age and overall health, your health care provider may do certain tests to screen for prostate and testicular cancer.  Talk to your health care provider about any symptoms or concerns you have about testicular or prostate cancer.  Skin Cancer  Check your skin from head to toe regularly.  Tell your health care provider about any new moles or changes in moles, especially if: ? There is a change in a mole's size, shape, or color. ? You have a mole that is larger than a pencil eraser.  Always use sunscreen. Apply sunscreen liberally and repeat throughout the day.  Protect yourself by wearing long sleeves, pants, a wide-brimmed hat, and sunglasses when outside.  What should I know about heart disease, diabetes, and high blood pressure?  If you are 18-39 years of age, have your blood pressure checked every 3-5 years. If you are 40 years of age or older, have your blood pressure checked every year. You should have your blood pressure measured twice-once when you are at a hospital or clinic, and once when   you are not at a hospital or clinic. Record the average of the two measurements. To check your blood pressure when you are not at a hospital or clinic, you can use: ? An automated blood pressure machine at a pharmacy. ? A home blood pressure monitor.  Talk to your health care provider about your target blood pressure.  If you are between 45-79 years old, ask your health care provider if you should take aspirin to prevent heart disease.  Have regular diabetes screenings by checking your fasting blood  sugar level. ? If you are at a normal weight and have a low risk for diabetes, have this test once every three years after the age of 45. ? If you are overweight and have a high risk for diabetes, consider being tested at a younger age or more often.  A one-time screening for abdominal aortic aneurysm (AAA) by ultrasound is recommended for men aged 65-75 years who are current or former smokers. What should I know about preventing infection? Hepatitis B If you have a higher risk for hepatitis B, you should be screened for this virus. Talk with your health care provider to find out if you are at risk for hepatitis B infection. Hepatitis C Blood testing is recommended for:  Everyone born from 1945 through 1965.  Anyone with known risk factors for hepatitis C.  Sexually Transmitted Diseases (STDs)  You should be screened each year for STDs including gonorrhea and chlamydia if: ? You are sexually active and are younger than 44 years of age. ? You are older than 44 years of age and your health care provider tells you that you are at risk for this type of infection. ? Your sexual activity has changed since you were last screened and you are at an increased risk for chlamydia or gonorrhea. Ask your health care provider if you are at risk.  Talk with your health care provider about whether you are at high risk of being infected with HIV. Your health care provider may recommend a prescription medicine to help prevent HIV infection.  What else can I do?  Schedule regular health, dental, and eye exams.  Stay current with your vaccines (immunizations).  Do not use any tobacco products, such as cigarettes, chewing tobacco, and e-cigarettes. If you need help quitting, ask your health care provider.  Limit alcohol intake to no more than 2 drinks per day. One drink equals 12 ounces of beer, 5 ounces of wine, or 1 ounces of hard liquor.  Do not use street drugs.  Do not share needles.  Ask your  health care provider for help if you need support or information about quitting drugs.  Tell your health care provider if you often feel depressed.  Tell your health care provider if you have ever been abused or do not feel safe at home. This information is not intended to replace advice given to you by your health care provider. Make sure you discuss any questions you have with your health care provider. Document Released: 02/04/2008 Document Revised: 04/06/2016 Document Reviewed: 05/12/2015 Elsevier Interactive Patient Education  2018 Elsevier [employer]     IF you received an x-ray today, you will receive an invoice from [facility]. Please contact [facility] at [phone] with questions or concerns regarding your invoice.   IF you received labwork today, you will receive an invoice from [facility]. Please contact [facility] at [phone] with questions or concerns regarding your invoice.   Our billing staff will not be   able to assist you with questions regarding bills from these companies.  You will be contacted with the lab results as soon as they are available. The fastest way to get your results is to activate your My Chart account. Instructions are located on the last page of this paperwork. If you have not heard from us regarding the results in 2 weeks, please contact this office.       

## 2017-12-02 LAB — COMPREHENSIVE METABOLIC PANEL
A/G RATIO: 1.9 (ref 1.2–2.2)
ALBUMIN: 4.4 g/dL (ref 3.5–5.5)
ALT: 35 IU/L (ref 0–44)
AST: 32 IU/L (ref 0–40)
Alkaline Phosphatase: 45 IU/L (ref 39–117)
BILIRUBIN TOTAL: 0.4 mg/dL (ref 0.0–1.2)
BUN / CREAT RATIO: 15 (ref 9–20)
BUN: 17 mg/dL (ref 6–24)
CALCIUM: 9.3 mg/dL (ref 8.7–10.2)
CHLORIDE: 104 mmol/L (ref 96–106)
CO2: 22 mmol/L (ref 20–29)
Creatinine, Ser: 1.15 mg/dL (ref 0.76–1.27)
GFR, EST AFRICAN AMERICAN: 89 mL/min/{1.73_m2} (ref >59)
GFR, EST NON AFRICAN AMERICAN: 77 mL/min/{1.73_m2} (ref >59)
Globulin, Total: 2.3 g/dL (ref 1.5–4.5)
Glucose: 81 mg/dL (ref 65–99)
POTASSIUM: 4.3 mmol/L (ref 3.5–5.2)
SODIUM: 141 mmol/L (ref 134–144)
TOTAL PROTEIN: 6.7 g/dL (ref 6.0–8.5)

## 2017-12-02 LAB — CBC
Hematocrit: 42.1 % (ref 37.5–51.0)
Hemoglobin: 13.8 g/dL (ref 13.0–17.7)
MCH: 29.1 pg (ref 26.6–33.0)
MCHC: 32.8 g/dL (ref 31.5–35.7)
MCV: 89 fL (ref 79–97)
Platelets: 255 10*3/uL (ref 150–379)
RBC: 4.74 x10E6/uL (ref 4.14–5.80)
RDW: 14 % (ref 12.3–15.4)
WBC: 4 10*3/uL (ref 3.4–10.8)

## 2017-12-02 LAB — HIV ANTIBODY (ROUTINE TESTING W REFLEX): HIV Screen 4th Generation wRfx: NONREACTIVE

## 2017-12-02 LAB — LIPID PANEL
Chol/HDL Ratio: 3.8 ratio (ref 0.0–5.0)
Cholesterol, Total: 255 mg/dL — ABNORMAL HIGH (ref 100–199)
HDL: 67 mg/dL (ref >39)
LDL Calculated: 169 mg/dL — ABNORMAL HIGH (ref 0–99)
Triglycerides: 93 mg/dL (ref 0–149)
VLDL Cholesterol Cal: 19 mg/dL (ref 5–40)

## 2017-12-02 LAB — TSH: TSH: 2.78 u[IU]/mL (ref 0.450–4.500)

## 2018-09-06 ENCOUNTER — Encounter: Payer: Self-pay | Admitting: Neurology

## 2018-10-02 ENCOUNTER — Ambulatory Visit: Payer: PRIVATE HEALTH INSURANCE | Admitting: Cardiovascular Disease
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
## 2018-11-13 ENCOUNTER — Ambulatory Visit: Payer: PRIVATE HEALTH INSURANCE | Admitting: Neurology

## 2019-01-28 ENCOUNTER — Ambulatory Visit: Payer: PRIVATE HEALTH INSURANCE | Admitting: Neurology

## 2019-02-26 ENCOUNTER — Other Ambulatory Visit: Payer: Self-pay | Admitting: *Deleted

## 2019-02-26 DIAGNOSIS — Z20822 Contact with and (suspected) exposure to covid-19: Secondary | ICD-10-CM

## 2019-03-04 LAB — NOVEL CORONAVIRUS, NAA: SARS-CoV-2, NAA: NOT DETECTED

## 2019-03-06 ENCOUNTER — Telehealth: Payer: Self-pay | Admitting: General Practice

## 2019-03-06 NOTE — Telephone Encounter (Signed)
Negative results was given to pt °

## 2019-03-18 ENCOUNTER — Ambulatory Visit: Payer: PRIVATE HEALTH INSURANCE | Admitting: Neurology

## 2019-06-17 ENCOUNTER — Encounter (HOSPITAL_COMMUNITY): Payer: Self-pay | Admitting: Emergency Medicine

## 2019-06-17 ENCOUNTER — Other Ambulatory Visit: Payer: Self-pay

## 2019-06-17 ENCOUNTER — Emergency Department (HOSPITAL_COMMUNITY): Payer: BC Managed Care – PPO

## 2019-06-17 ENCOUNTER — Emergency Department (HOSPITAL_COMMUNITY)
Admission: EM | Admit: 2019-06-17 | Discharge: 2019-06-17 | Disposition: A | Discharge status: Home / Self Care | Payer: BC Managed Care – PPO | Attending: Emergency Medicine | Admitting: Emergency Medicine

## 2019-06-17 DIAGNOSIS — M542 Cervicalgia: Secondary | ICD-10-CM | POA: Diagnosis present

## 2019-06-17 DIAGNOSIS — R202 Paresthesia of skin: Secondary | ICD-10-CM | POA: Insufficient documentation

## 2019-06-17 DIAGNOSIS — Z5321 Procedure and treatment not carried out due to patient leaving prior to being seen by health care provider: Secondary | ICD-10-CM | POA: Insufficient documentation

## 2019-06-17 NOTE — ED Notes (Signed)
pts wife would like a call whenever he is in a room
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
## 2019-06-17 NOTE — ED Notes (Signed)
Called CT to get ETA on CT study.

## 2019-06-17 NOTE — ED Notes (Signed)
Pt stated that he would be leaving and seeing his primary care in the AM

## 2019-06-26 ENCOUNTER — Ambulatory Visit: Payer: BC Managed Care – PPO | Attending: Family Medicine | Admitting: Physical Therapy

## 2019-06-26 ENCOUNTER — Encounter: Payer: Self-pay | Admitting: Physical Therapy

## 2019-06-26 ENCOUNTER — Other Ambulatory Visit: Payer: Self-pay

## 2019-06-26 DIAGNOSIS — M542 Cervicalgia: Secondary | ICD-10-CM | POA: Insufficient documentation

## 2019-06-26 NOTE — Patient Instructions (Signed)
Medbridge Access Code: [email or web address]
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
## 2019-07-04 ENCOUNTER — Ambulatory Visit: Payer: BC Managed Care – PPO

## 2019-07-09 ENCOUNTER — Encounter: Payer: Self-pay | Admitting: Physical Therapy

## 2019-07-09 ENCOUNTER — Other Ambulatory Visit: Payer: Self-pay

## 2019-07-09 ENCOUNTER — Ambulatory Visit: Payer: BC Managed Care – PPO | Admitting: Physical Therapy

## 2019-07-09 DIAGNOSIS — M542 Cervicalgia: Secondary | ICD-10-CM | POA: Diagnosis not present

## 2019-07-09 NOTE — Therapy (Signed)
[facility] [address] [place], [place], [address] Phone: [phone]   Fax:  [phone]  Physical Therapy Treatment  Patient Details  Name: Frank Kelly MRN: [id number] Date of Birth: June 13, 1976 Referring Provider (PT): Mila Palmer, MD   Encounter Date: 07/09/2019  PT End of Session - 07/09/19 1625    Visit Number  2    Number of Visits  12    Date for PT Re-Evaluation  08/21/19    Authorization Type  BCBS    PT Start Time  1530    PT Stop Time  1612    PT Time Calculation (min)  42 min    Activity Tolerance  Patient tolerated treatment well    Behavior During Therapy  [employer] for tasks assessed/performed       Past Medical History:  Diagnosis Date  . Allergy     Past Surgical History:  Procedure Laterality Date  . APPENDECTOMY    . LAPAROSCOPIC APPENDECTOMY N/A 11/17/2015   Procedure: APPENDECTOMY LAPAROSCOPIC;  Surgeon: Luretha Murphy, MD;  Location: [facility];  Service: General;  Laterality: N/A;  . MOUTH SURGERY      There were no vitals filed for this visit.  Subjective Assessment - 07/09/19 1622    Subjective  Patient report continued left arm numbness and feeling of weakness. He has returned to work and he is just cautious to make sure he does not drop any equipment. He is able to feel when the numbness is about to occur and he is able to reposition to keep it from happening. He does report his neck soreness has resolved.    Currently in Pain?  No/denies    Pain Radiating Towards  left arm numbness and tingling, tingling typically starts in hand and will then feel like numbness washes down arm    Pain Onset  1 to 4 weeks ago    Pain Frequency  Intermittent         [facility] PT Assessment - 07/09/19 0001      ROM / Strength   AROM / PROM / Strength  AROM      AROM   Overall AROM Comments  Cervical AROM WFL and non-painful      Palpation   Palpation comment  Mild concordant symptoms noted with palpation  of left upper trap and infra/teres minor region      Special Tests    Special Tests  Cervical    Other special tests  Neurodynamic testing (median, radial, ulnar) negative for reproduction of symptoms    Cervical Tests  Dictraction;Spurling's      Spurling's   Findings  Negative    Side  Left      Distraction Test   Findngs  Negative    side  Left                   [facility] Adult PT Treatment/Exercise - 07/09/19 0001      Exercises   Exercises  Neck      Neck Exercises: Theraband   Shoulder External Rotation  10 reps   yellow   Horizontal ABduction  10 reps   yellow band     Neck Exercises: Seated   Neck Retraction  10 reps;3 secs      Manual Therapy   Manual Therapy  Soft tissue mobilization;Myofascial release;Manual Traction    Soft tissue mobilization  Left upper trap and levator, infra and teres minor region    Myofascial Release  Left  upper trap    Manual Traction  Suboccipital release      Neck Exercises: Stretches   Upper Trapezius Stretch  30 seconds    Levator Stretch  30 seconds             PT Education - 07/09/19 1624    Education Details  HEP, tennis ball for SMFR    Person(s) Educated  Patient    Methods  Explanation;Demonstration;Verbal cues    Comprehension  Verbalized understanding;Returned demonstration;Verbal cues required;Need further instruction       PT Short Term Goals - 06/26/19 1546      PT SHORT TERM GOAL #1   Title  Patient will be independent with HEP to maintain progress in PT.    Baseline  New exercises given at eval    Time  3    Period  Weeks    Status  New    Target Date  07/17/19      PT SHORT TERM GOAL #2   Title  Patient will report reduced neck pain and stiffness to 0-1/10 to allow for safe return to work.    Baseline  2/10    Time  3    Period  Weeks    Status  New    Target Date  07/17/19        PT Long Term Goals - 06/26/19 1549      PT LONG TERM GOAL #1   Title  Patient will report  improved function as measured with FOTO of < or = 25% limitation.    Baseline  41% limitation    Time  8    Period  Weeks    Status  New    Target Date  08/21/19      PT LONG TERM GOAL #2   Title  Patient will be independent with final HEP to allow him to continue improvement in neck and arm symptoms.    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    Time  8    Period  Weeks    Status  New    Target Date  08/21/19            Plan - 07/09/19 1627    Clinical Impression Statement  Patient tolerated therapy well. He had negative testing for neurodynamic and cervical radicular symptoms but did report mild concordant left finger tingling with palpation of left upper trap and infra/teres minor region. He noted improved mobility following manual but no change hand tingling symptoms. It seems at this point his left arm symptoms could be muscular referal and he may benefit from dry needling. He would benefit from continued skilled PT to reduce left arm numbness so he can return to work safely.    PT Treatment/Interventions  ADLs/Self Care Home Management;Cryotherapy;Electrical Stimulation;Moist Heat;Traction;Ultrasound;Therapeutic exercise;Therapeutic activities;Functional mobility training;Neuromuscular re-education;Patient/family education;Dry needling;Passive range of motion;Manual techniques;Taping;Spinal  Manipulations;Joint Manipulations    PT Next Visit Plan  Reassess HEP, manual and modalities for left upper trap and posterior cuff as needed to reduce pain and muscle guarding, cervical and thoracic mobility,  postural training    PT Home Exercise Plan  Seated upper trap and levator stretch, chin tucks, side lying thoracic rotation stretch, standing T with yellow band    Consulted and Agree with Plan of Care  Patient       Patient will benefit from skilled therapeutic intervention in order to improve the following deficits and impairments:  Decreased range of motion, Increased muscle spasms, Impaired UE functional use, Decreased activity tolerance, Pain, Postural dysfunction  Visit Diagnosis: Cervicalgia     Problem List Patient Active Problem List   Diagnosis Date Noted  . S/P lap appendectomy March 2017 11/17/2015  . S/P laparoscopic appendectomy 11/17/2015    Hilda Blades, PT, DPT, LAT, ATC 07/09/19  4:41 PM Phone: [phone] Fax: [place] [facility] [address] [place], [place], [address] Phone: [phone]   Fax:  [phone]  Name: Demaree Liberto MRN: [id number] Date of Birth: Feb 21, 1976

## 2019-07-11 ENCOUNTER — Ambulatory Visit: Payer: BC Managed Care – PPO | Admitting: Physical Therapy

## 2019-07-15 ENCOUNTER — Ambulatory Visit: Payer: BC Managed Care – PPO

## 2019-07-15 ENCOUNTER — Other Ambulatory Visit: Payer: Self-pay

## 2019-07-15 DIAGNOSIS — M542 Cervicalgia: Secondary | ICD-10-CM

## 2019-07-15 NOTE — Patient Instructions (Signed)
Issued from cabinet band exercise ER with scap retraction, hor abduct, flexion with arm pulls , diagonals,   Rows and shoulder extension  Green band x 10-20 reps daily

## 2019-07-15 NOTE — Therapy (Addendum)
[place], [place], [address] Phone: [phone]   Fax:  [phone]  Physical Therapy Treatment / Discharge  Patient Details  Name: Frank Kelly MRN: [id number] Date of Birth: [birth year] Referring Provider (PT): Jonathon Jordan, MD   Encounter Date: 07/15/2019  PT End of Session - 07/15/19 1616    Visit Number  3    Number of Visits  12    Date for PT Re-Evaluation  08/21/19    Authorization Type  BCBS    PT Start Time  0415    PT Stop Time  0440    PT Time Calculation (min)  25 min    Activity Tolerance  Patient tolerated treatment well    Behavior During Therapy  [facility] for tasks assessed/performed       Past Medical History:  Diagnosis Date  . Allergy     Past Surgical History:  Procedure Laterality Date  . APPENDECTOMY    . LAPAROSCOPIC APPENDECTOMY N/A 11/17/2015   Procedure: APPENDECTOMY LAPAROSCOPIC;  Surgeon: Johnathan Hausen, MD;  Location: [facility];  Service: General;  Laterality: N/A;  . MOUTH SURGERY      There were no vitals filed for this visit.  Subjective Assessment - 07/15/19 1618    Subjective  Since last visit alot better. issue has not returned since then  with no pain with numbness / tingleing.  has resolved.                       [facility] Adult PT Treatment/Exercise - 07/15/19 0001      Neck Exercises: Seated   Other Seated Exercise  red band ER and hor abduction  and diagonals x 15 red band, and pull lateral red band and flexion x 12 overhead             PT Education - 07/15/19 1644    Education Details  band HEP    Person(s) Educated  Patient    Methods  Explanation;Demonstration;Tactile cues;Verbal cues;Handout    Comprehension  Returned demonstration;Verbalized understanding       PT Short Term Goals - 07/15/19 1647      PT SHORT TERM GOAL #1   Title  Patient will be independent with HEP to maintain progress in PT.    Status  Achieved       PT SHORT TERM GOAL #2   Title  Patient will report reduced neck pain and stiffness to 0-1/10 to allow for safe return to work.    Status  Achieved        PT Long Term Goals - 06/26/19 1549      PT LONG TERM GOAL #1   Title  Patient will report improved function as measured with FOTO of < or = 25% limitation.    Baseline  41% limitation    Time  8    Period  Weeks    Status  New    Target Date  08/21/19      PT LONG TERM GOAL #2   Title  Patient will be independent with final HEP to allow him to continue improvement in neck and arm symptoms.    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    Time  8    Period  Weeks    Status  New    Target Date  08/21/19            Plan - 07/15/19 1645    Clinical Impression Statement  it appears his symptoms have resolved so I started on band strengthening and if no symptoms with the exercises , progress stab/strength exrcises for home    PT Treatment/Interventions  ADLs/Self Care Home Management;Cryotherapy;Electrical Stimulation;Moist Heat;Traction;Ultrasound;Therapeutic exercise;Therapeutic activities;Functional mobility training;Neuromuscular re-education;Patient/family education;Dry needling;Passive range of motion;Manual techniques;Taping;Spinal Manipulations;Joint Manipulations    PT Next Visit Plan  Reassess HEP, manual and modalities for left upper trap and posterior cuff as needed to reduce pain and muscle guarding, cervical and thoracic mobility, postural training    PT Home Exercise Plan  Seated upper trap and levator stretch, chin tucks, side lying  thoracic rotation stretch, standing T with yellow band, green band ER, hor abduct, diagonals , flexion with band pul,  ros and shoulder extension    Consulted and Agree with Plan of Care  Patient       Patient will benefit from skilled therapeutic intervention in order to improve the following deficits and impairments:  Decreased range of motion, Increased muscle spasms, Impaired UE functional use, Decreased activity tolerance, Pain, Postural dysfunction  Visit Diagnosis: Cervicalgia     Problem List Patient Active Problem List   Diagnosis Date Noted  . S/P lap appendectomy March 2017 11/17/2015  . S/P laparoscopic appendectomy 11/17/2015    Darrel Hoover PT 07/15/2019, 4:48 PM  [facility] [address] [place], [place], [address] Phone: [phone]   Fax:  [phone]  Name: Frank Kelly MRN: [id number] Date of Birth: May 04, 1976    PHYSICAL THERAPY DISCHARGE SUMMARY  Visits from Start of Care: 3  Current functional level related to goals / functional outcomes: Patient canceled his last PT appointment on 07/22/2019, he was contacted on 07/22/2019 and stated he no longer needed formal PT because his symptoms had resolved.    Remaining deficits: None - patient reported   Education / Equipment: HEP  Plan: Patient agrees to discharge.  Patient goals were not met. Patient is being discharged due to being pleased with the current functional level.  ?????    Hilda Blades, PT, DPT, LAT, ATC 07/22/19  8:43 AM Phone: [phone] Fax: [phone]

## 2019-07-17 ENCOUNTER — Ambulatory Visit: Payer: BC Managed Care – PPO | Admitting: Physical Therapy

## 2019-07-22 ENCOUNTER — Ambulatory Visit: Payer: BC Managed Care – PPO | Admitting: Physical Therapy

## 2023-01-24 ENCOUNTER — Other Ambulatory Visit (HOSPITAL_BASED_OUTPATIENT_CLINIC_OR_DEPARTMENT_OTHER): Payer: Self-pay | Admitting: Family Medicine

## 2023-01-24 DIAGNOSIS — Z0000 Encounter for general adult medical examination without abnormal findings: Secondary | ICD-10-CM | POA: Diagnosis not present

## 2023-01-24 DIAGNOSIS — Z23 Encounter for immunization: Secondary | ICD-10-CM | POA: Diagnosis not present

## 2023-01-24 DIAGNOSIS — R0602 Shortness of breath: Secondary | ICD-10-CM | POA: Diagnosis not present

## 2023-01-24 DIAGNOSIS — Z125 Encounter for screening for malignant neoplasm of prostate: Secondary | ICD-10-CM | POA: Diagnosis not present

## 2023-01-24 DIAGNOSIS — Z136 Encounter for screening for cardiovascular disorders: Secondary | ICD-10-CM | POA: Diagnosis not present

## 2023-02-01 DIAGNOSIS — R200 Anesthesia of skin: Secondary | ICD-10-CM | POA: Diagnosis not present

## 2023-02-01 DIAGNOSIS — R29898 Other symptoms and signs involving the musculoskeletal system: Secondary | ICD-10-CM | POA: Diagnosis not present

## 2023-02-01 DIAGNOSIS — M549 Dorsalgia, unspecified: Secondary | ICD-10-CM | POA: Diagnosis not present

## 2023-02-14 DIAGNOSIS — M5416 Radiculopathy, lumbar region: Secondary | ICD-10-CM | POA: Diagnosis not present

## 2023-02-14 DIAGNOSIS — M25551 Pain in right hip: Secondary | ICD-10-CM | POA: Diagnosis not present

## 2023-02-14 DIAGNOSIS — Z6828 Body mass index (BMI) 28.0-28.9, adult: Secondary | ICD-10-CM | POA: Diagnosis not present

## 2023-02-15 ENCOUNTER — Ambulatory Visit (HOSPITAL_BASED_OUTPATIENT_CLINIC_OR_DEPARTMENT_OTHER)
Admission: RE | Admit: 2023-02-15 | Discharge: 2023-02-15 | Disposition: A | Discharge status: Home / Self Care | Payer: PRIVATE HEALTH INSURANCE | Source: Ambulatory Visit | Attending: Family Medicine | Admitting: Family Medicine

## 2023-02-15 DIAGNOSIS — R0602 Shortness of breath: Secondary | ICD-10-CM | POA: Insufficient documentation

## 2023-02-16 ENCOUNTER — Other Ambulatory Visit: Payer: Self-pay | Admitting: Student in an Organized Health Care Education/Training Program

## 2023-02-16 DIAGNOSIS — M5416 Radiculopathy, lumbar region: Secondary | ICD-10-CM

## 2023-02-22 ENCOUNTER — Encounter: Payer: Self-pay | Admitting: Student in an Organized Health Care Education/Training Program

## 2023-02-22 ENCOUNTER — Ambulatory Visit
Admission: RE | Admit: 2023-02-22 | Discharge: 2023-02-22 | Disposition: A | Discharge status: Home / Self Care | Payer: BC Managed Care – PPO | Source: Ambulatory Visit | Attending: Student in an Organized Health Care Education/Training Program | Admitting: Student in an Organized Health Care Education/Training Program

## 2023-02-22 ENCOUNTER — Other Ambulatory Visit: Payer: Self-pay

## 2023-02-22 DIAGNOSIS — M5126 Other intervertebral disc displacement, lumbar region: Secondary | ICD-10-CM | POA: Diagnosis not present

## 2023-02-22 DIAGNOSIS — M5416 Radiculopathy, lumbar region: Secondary | ICD-10-CM

## 2023-02-22 DIAGNOSIS — M47816 Spondylosis without myelopathy or radiculopathy, lumbar region: Secondary | ICD-10-CM | POA: Diagnosis not present

## 2023-02-27 DIAGNOSIS — E785 Hyperlipidemia, unspecified: Secondary | ICD-10-CM | POA: Diagnosis not present

## 2023-03-14 DIAGNOSIS — M5416 Radiculopathy, lumbar region: Secondary | ICD-10-CM | POA: Diagnosis not present

## 2023-04-17 DIAGNOSIS — E785 Hyperlipidemia, unspecified: Secondary | ICD-10-CM | POA: Diagnosis not present

## 2023-05-05 ENCOUNTER — Ambulatory Visit: Payer: BC Managed Care – PPO | Admitting: Diagnostic Neuroimaging

## 2023-05-15 DIAGNOSIS — M5416 Radiculopathy, lumbar region: Secondary | ICD-10-CM | POA: Diagnosis not present

## 2023-05-15 DIAGNOSIS — M545 Low back pain, unspecified: Secondary | ICD-10-CM | POA: Diagnosis not present

## 2023-05-22 DIAGNOSIS — M5416 Radiculopathy, lumbar region: Secondary | ICD-10-CM | POA: Diagnosis not present

## 2023-06-07 DIAGNOSIS — M5416 Radiculopathy, lumbar region: Secondary | ICD-10-CM | POA: Diagnosis not present

## 2023-09-15 ENCOUNTER — Other Ambulatory Visit: Payer: Self-pay | Admitting: Family Medicine

## 2023-09-15 DIAGNOSIS — R519 Headache, unspecified: Secondary | ICD-10-CM

## 2023-09-18 ENCOUNTER — Encounter: Payer: Self-pay | Admitting: Family Medicine

## 2023-10-02 ENCOUNTER — Ambulatory Visit
Admission: RE | Admit: 2023-10-02 | Discharge: 2023-10-02 | Disposition: A | Discharge status: Home / Self Care | Payer: BC Managed Care – PPO | Source: Ambulatory Visit | Attending: Family Medicine | Admitting: Family Medicine

## 2023-10-02 DIAGNOSIS — R519 Headache, unspecified: Secondary | ICD-10-CM

## 2023-10-02 MED ORDER — GADOPICLENOL 0.5 MMOL/ML IV SOLN
10.0000 mL | Freq: Once | INTRAVENOUS | Status: AC | PRN
  Administered 2023-10-02: 10 mL via INTRAVENOUS

## 2024-01-11 ENCOUNTER — Encounter: Payer: Self-pay | Admitting: Neurology

## 2024-01-11 DIAGNOSIS — R1033 Periumbilical pain: Secondary | ICD-10-CM | POA: Insufficient documentation

## 2024-01-11 DIAGNOSIS — R140 Abdominal distension (gaseous): Secondary | ICD-10-CM | POA: Insufficient documentation

## 2024-01-12 ENCOUNTER — Ambulatory Visit (INDEPENDENT_AMBULATORY_CARE_PROVIDER_SITE_OTHER): Payer: BC Managed Care – PPO | Admitting: Neurology

## 2024-01-12 ENCOUNTER — Encounter: Payer: Self-pay | Admitting: Neurology

## 2024-01-12 VITALS — BP 130/92 | HR 79 | Ht 69.0 in | Wt 204.0 lb

## 2024-01-12 DIAGNOSIS — R519 Headache, unspecified: Secondary | ICD-10-CM | POA: Diagnosis not present

## 2024-01-12 NOTE — Progress Notes (Signed)
 Chief Complaint  Patient presents with   New Patient (Initial Visit)    Pt in room 14. Alone.  Paper referral for Headache and white matter disease on MRI. Patient reports he has sharp pain that shoots at top of head always on right side. Last for a couple of seconds.      ASSESSMENT AND PLAN  Frank Kelly is a 48 y.o. 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   MEDICAL HISTORY:  Frank Kelly is a 48 year old male, seen in request by his primary care from [facility] Dr. Maryrose Soja, [place], for evaluation of intermittent headaches, MRI scans, initial evaluation Jan 12 2024    History is obtained from the patient and review of electronic medical records. I personally reviewed pertinent available imaging films in PACS.   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 handling his job, over the past couple years, has occasionally sharp transient pain, often at the right parietal region, only last for few seconds, but can be so intense, to the point to take away his breath  It only happened intermittently, but early 2025, he had an episode while holding a shopping cart, mild wooziness, leading to MRI of the brain, without contrast on October 02, 2023, reviewed film with patient, mild small vessel disease, can be related to aging, or migraine sequelae  He denies significant history of headache, but has frequent sinus issues, sinus pressure, MRI of the brain did show chronic sinusitis,   PHYSICAL EXAM:   Vitals:   01/12/24 1122  BP: (!) 130/92  Pulse: 79  Weight: 204 lb (92.5 kg)  Height: 5\' 9"  (1.753 m)   Body mass index is 30.13  kg/m.  PHYSICAL EXAMNIATION:  Gen: NAD, conversant, well nourised, well groomed                     Cardiovascular: Regular rate rhythm, no peripheral edema, warm, nontender. Eyes: Conjunctivae clear without exudates or hemorrhage Neck: Supple, no carotid bruits. Pulmonary: Clear to auscultation bilaterally   NEUROLOGICAL EXAM:  MENTAL STATUS: Speech/cognition: Awake, alert, oriented to history taking and casual conversation CRANIAL NERVES: CN II: Visual fields are full to confrontation. Pupils are round equal and briskly reactive to light.  Funduscopy examination were normal bilaterally CN III, IV, VI: extraocular movement are normal. No ptosis. CN V: Facial sensation is intact to light touch CN VII: Face is symmetric with normal eye closure  CN VIII: Hearing is normal to causal conversation. CN IX, X: Phonation is normal. CN XI: Head turning and shoulder shrug are intact  MOTOR: There is no pronator drift of out-stretched arms. Muscle bulk and tone are normal. Muscle strength is normal.  REFLEXES: Reflexes are 2+ and symmetric at the biceps, triceps, knees, and ankles. Plantar responses are flexor.  SENSORY: Intact to light touch, pinprick and vibratory sensation are intact in fingers and toes.  COORDINATION: There is no trunk or limb dysmetria noted.  GAIT/STANCE: Posture is normal. Gait is steady with normal steps, base, arm swing, and turning. Heel and toe walking are normal. Tandem gait is normal.  Romberg is absent.  REVIEW OF SYSTEMS:  Full 14 system review of systems performed and notable only for as above All other review of systems were negative.   ALLERGIES: No Known Allergies  HOME MEDICATIONS: Current Outpatient Medications  Medication Sig Dispense Refill   methocarbamol  (ROBAXIN ) 500 MG tablet Take 1 tablet (500 mg total) by mouth 2 (two) times daily. (Patient not taking: Reported on 01/12/2024) 20 tablet 0   naproxen  (NAPROSYN ) 500 MG tablet Take 1  tablet (500 mg total) by mouth 2 (two) times daily. (Patient not taking: Reported on 01/12/2024) 30 tablet 0   oxyCODONE -acetaminophen  (PERCOCET/ROXICET) 5-325 MG tablet Take 1 tablet by mouth every 6 (six) hours as needed for severe pain. (Patient not taking: Reported on 01/12/2024) 15 tablet 0   rosuvastatin (CRESTOR) 10 MG tablet Take 10 mg by mouth daily. (Patient not taking: Reported on 01/12/2024)     No current facility-administered medications for this visit.    PAST MEDICAL HISTORY: Past Medical History:  Diagnosis Date   Allergy    Cervical disc disease    Head ache    Sinusitis chronic, frontal     PAST SURGICAL HISTORY: Past Surgical History:  Procedure Laterality Date   APPENDECTOMY     LAPAROSCOPIC APPENDECTOMY N/A 11/17/2015   Procedure: APPENDECTOMY LAPAROSCOPIC;  Surgeon: Jacolyn Matar, MD;  Location: [facility];  Service: General;  Laterality: N/A;   MOUTH SURGERY      FAMILY HISTORY: Family History  Problem Relation Age of Onset   Hypertension Mother     SOCIAL HISTORY: Social History   Socioeconomic History   Marital status: Married    Spouse name: Not on file   Number of children: Not on file   Years of education: Not on file   Highest education level: Not on file  Occupational History   Not on file  Tobacco Use   Smoking status: Never   Smokeless tobacco: Never  Vaping Use   Vaping status: Never Used  Substance and Sexual Activity   Alcohol use: Yes    Comment: 2 shots of liquor   Drug use: No   Sexual activity: Not on file  Other Topics Concern   Not on file  Social History Narrative   Not on file   Social Drivers of Health   Financial Resource Strain: Not on file  Food Insecurity: Not on file  Transportation Needs: Not on file  Physical Activity: Not on file  Stress: Not on file  Social Connections: Not on file  Intimate Partner Violence: Not on file      Phebe Brasil, M.D. Ph.D.  [facility] [address],  [address] [place], [place] [address] Ph: [phone] Fax: [phone]  CC:  Ronna Coho, MD [address] [place],  [place] [address]  Pcp, No
